# Patient Record
Sex: Female | Born: 1985 | Race: Black or African American | Hispanic: No | Marital: Single
Health system: Southern US, Community
[De-identification: ages and names within clinical notes are randomized; demographics above are authoritative.]

---

## 2017-08-02 ENCOUNTER — Emergency Department (HOSPITAL_COMMUNITY): Payer: PRIVATE HEALTH INSURANCE

## 2017-08-02 ENCOUNTER — Emergency Department (HOSPITAL_COMMUNITY)
Admission: EM | Admit: 2017-08-02 | Discharge: 2017-08-02 | Disposition: A | Discharge status: Home / Self Care | Payer: PRIVATE HEALTH INSURANCE | Attending: Emergency Medicine | Admitting: Emergency Medicine

## 2017-08-02 ENCOUNTER — Other Ambulatory Visit: Payer: Self-pay

## 2017-08-02 ENCOUNTER — Encounter (HOSPITAL_COMMUNITY): Payer: Self-pay

## 2017-08-02 DIAGNOSIS — M25571 Pain in right ankle and joints of right foot: Secondary | ICD-10-CM | POA: Diagnosis not present

## 2017-08-02 DIAGNOSIS — W19XXXA Unspecified fall, initial encounter: Secondary | ICD-10-CM

## 2017-08-02 DIAGNOSIS — M25572 Pain in left ankle and joints of left foot: Secondary | ICD-10-CM | POA: Diagnosis not present

## 2017-08-02 MED ORDER — HYDROCODONE-ACETAMINOPHEN 5-325 MG PO TABS
1.0000 | ORAL_TABLET | Freq: Once | ORAL | Status: AC
  Administered 2017-08-02: 1 via ORAL
  Filled 2017-08-02: qty 1

## 2017-08-02 NOTE — Discharge Instructions (Signed)
X-rays negative.Hairline/stress fractures are often not visualized on x-ray, these are usually treated with rest, ice, elevation, anti-inflammatories and hard soled shoes for protection.  Take 1000 mg of Tylenol +600 mg of ibuprofen every 6-8 hours for pain and swelling.  Where your crutches for the next 3-5 days or until able to ambulate with minimal pain. You should wear postop shoe for at least 7-10 days. Follow up with a primary care doctor in 7-10 days for reevaluation and medical clearance.

## 2017-08-02 NOTE — Progress Notes (Signed)
Orthopedic Tech Progress Note Patient Details:  Carol Copeland [birth year] [id number]  Ortho Devices Type of Ortho Device: Crutches, Postop shoe/boot Ortho Device/Splint Location: RLE Ortho Device/Splint Interventions: Ordered, Application, Adjustment   Post Interventions Patient Tolerated: Well Instructions Provided: Care of device   [name], [name] 08/02/2017, 9:40 PM

## 2017-08-02 NOTE — ED Notes (Signed)
Ortho tech paged  

## 2017-08-02 NOTE — ED Triage Notes (Signed)
Pt presents to the ed for a falling from a standing position this morning, states she tripped and fell down four steps. Pt only complaint is bilateral foot pain. Denies LOC

## 2017-08-02 NOTE — Progress Notes (Signed)
Orthopedic Tech Progress Note Patient Details:  Carol SillShakira Copeland 05-07-1986 [id number]  Ortho Devices Type of Ortho Device: CAM walker Ortho Device/Splint Location: RLE Ortho Device/Splint Interventions: Ordered, Application   Post Interventions Patient Tolerated: Well Instructions Provided: Care of device   Carol MoccasinHughes, Carol Copeland 08/02/2017, 10:44 PM

## 2017-08-02 NOTE — ED Provider Notes (Addendum)
[facility] EMERGENCY DEPARTMENT Provider Note   CSN: [id number] Arrival date & time: 08/02/17  1703     History   Chief Complaint Chief Complaint  Patient presents with  . Fall    HPI Dellie Heinke is a 32 y.o. female here for evaluation of bilateral foot pain onset early this morning, states she was walking through the door and did not realize that there were 4 steps to go down. States she tripped and fell down all 4 steps mostly landing on her heels. Pain is worse to the left lateral aspect of the left ankle and top of her right foot. There is some mild associated swelling. No interventions PTA. Alleviating factors none. Aggravating factors include been weightbearing and palpation.Denies any tingling or numbness distally. There was no head trauma, LOC during the fall. No anticoagulants.  HPI  History reviewed. No pertinent past medical history.  There are no active problems to display for this patient.   History reviewed. No pertinent surgical history.   OB History   None      Home Medications    Prior to Admission medications   Not on File    Family History No family history on file.  Social History Social History   Tobacco Use  . Smoking status: Not on file  Substance Use Topics  . Alcohol use: Not on file  . Drug use: Not on file     Allergies   Penicillins   Review of Systems Review of Systems  Musculoskeletal: Positive for arthralgias, gait problem and joint swelling.  All other systems reviewed and are negative.    Physical Exam Updated Vital Signs BP (!) 142/87 (BP Location: Left Arm)   Pulse 83   Temp 98.9 F (37.2 C) (Oral)   Resp 16   Ht 5\' 6"  (1.676 m)   Wt 131.5 kg (290 lb)   LMP 08/02/2017   SpO2 100%   BMI 46.81 kg/m   Physical Exam  Constitutional: She is oriented to person, place, and time. She appears well-developed and well-nourished.  Non-toxic appearance.  HENT:  Head: Normocephalic.  Right  Ear: External ear normal.  Left Ear: External ear normal.  Nose: Nose normal.  Eyes: Conjunctivae and EOM are normal.  Neck: Full passive range of motion without pain.  Cardiovascular: Normal rate.  2+ DP and PT pulses bilaterally. Toes are warm.  Pulmonary/Chest: Effort normal. No tachypnea. No respiratory distress.  Musculoskeletal: Normal range of motion. She exhibits edema and tenderness.  Left ankle/foot: Mild tenderness to area below the left lateral malleolus. Positive talar tilt. No obvious bony deformity to the left ankle or foot. Achilles is intact. No calcaneal tenderness. Patient able to dorsiflex and plantarflex the ankle with mild discomfort. No obvious laxity to the left ankle. Able to wiggle toes.negative syndesmosis test. Right ankle/foot:mild edema and tenderness along the second, third, fourth, fifth metatarsals. Patient is still able to wiggle her right toes with mild pain. Negative talar tilt. No obvious bony deformity otherwise to the left ankle or foot. Achilles is intact. Pale tenderness. He will to dorsiflex and plantarflex the ankle without discomfort. No obvious laxity to the right ankle. Negative syndesmosis test.  Neurological: She is alert and oriented to person, place, and time.  5/5 strength with ankle flexion/extension, bilaterally. Sensation to light touch grossly intact in bilateral feet.  Skin: Skin is warm and dry. Capillary refill takes less than 2 seconds.  Psychiatric: Her behavior is normal. Thought content normal.  ED Treatments / Results  Labs (all labs ordered are listed, but only abnormal results are displayed) Labs Reviewed - No data to display  EKG None  Radiology Dg Foot Complete Left  Result Date: 08/02/2017 CLINICAL DATA:  Recent fall down stairs, patient reports left and right foot swelling and pain. EXAM: LEFT FOOT - COMPLETE 3+ VIEW COMPARISON:  None. FINDINGS: No fracture.  No bone lesion. The joints are normally spaced and  aligned. There is mild forefoot soft tissue swelling. IMPRESSION: No fracture or dislocation. Electronically Signed   By: Amie Portlandavid  Ormond M.D.   On: 08/02/2017 20:15   Dg Foot Complete Right  Result Date: 08/02/2017 CLINICAL DATA:  Recent fall down stairs, patient reports left and right foot swelling and pain. EXAM: RIGHT FOOT COMPLETE - 3+ VIEW COMPARISON:  None. FINDINGS: There is deformity of the distal second and third metatarsals without a discrete fracture line or disruption of the trabecula. This may be the result of old trauma. No convincing acute fracture. The joints are normally spaced and aligned. There is mild forefoot soft tissue swelling. IMPRESSION: No convincing acute fracture or dislocation. Electronically Signed   By: Amie Portlandavid  Ormond M.D.   On: 08/02/2017 20:13    Procedures Procedures (including critical care time)  Medications Ordered in ED Medications  HYDROcodone-acetaminophen (NORCO/VICODIN) 5-325 MG per tablet 1 tablet (1 tablet Oral Given 08/02/17 1911)     Initial Impression / Assessment and Plan / ED Course  I have reviewed the triage vital signs and the nursing notes.  Pertinent labs & imaging results that were available during my care of the patient were reviewed by me and considered in my medical decision making (see chart for details).    X-rays as above without fracture, dislocation. There is a questionable old bony injury to the distal aspect of the second and third metatarsals without convincing acute fracture, patient does have edema and tenderness focally here. She remembers remote injury to this area There may be a small stress/hairline fracture to this area that is not presenting itself on the x-ray. Will treat with cam walker on right foot, crutches so she is able to ambulate comfortably and stay off the left foot as well. She stood up in ED and had brief episode of light-headedness that resolved when she sat down. VS WNL at this time. She attempted walking in  ED and able to without dizziness. Rice protocol.  Discussed return precautions. Patient verbalized understanding and agreeable.  Final Clinical Impressions(s) / ED Diagnoses   Final diagnoses:  Fall, initial encounter  Acute bilateral ankle pain    ED Discharge Orders    None       Jerrell MylarGibbons, Ravindra Baranek J, PA-C 08/02/17 2249    [name], [name], MD 08/14/17 1529

## 2017-08-02 NOTE — ED Provider Notes (Signed)
Patient placed in Quick Look pathway, seen and evaluated   Chief Complaint: bilateral foot pain  HPI:   Carol Copeland is a 32 y.o. female who presents to the ED with bilateral foot pain after falling down steps at a hotel early this morning. Patient reports that she tripped causing the fall. Patient denies any injuries other than her feet. She took ibuprofen earlier today without relief.   ROS: M/S: bilateral foot pain  Physical Exam:   Gen: No distress  Neuro: Awake and Alert  Skin: Warm and dry  M/S: swelling to the dorsum of both feet, tender with range of motion and palpation. Pedal pulses present.     Focused Exam:    Initiation of care has begun. The patient has been counseled on the process, plan, and necessity for staying for the completion/evaluation, and the remainder of the medical screening examination    [name], [name], NP 08/02/17 1911    [name], [name], MD 08/14/17 [phone]

## 2019-01-30 IMAGING — CR DG FOOT COMPLETE 3+V*L*
3 series · 3 of 3 positions shown · non-contrast
Comparison: None.

CLINICAL DATA: Recent fall down stairs, patient reports left and
right foot swelling and pain.

EXAM:
LEFT FOOT - COMPLETE 3+ VIEW

[foot ap]
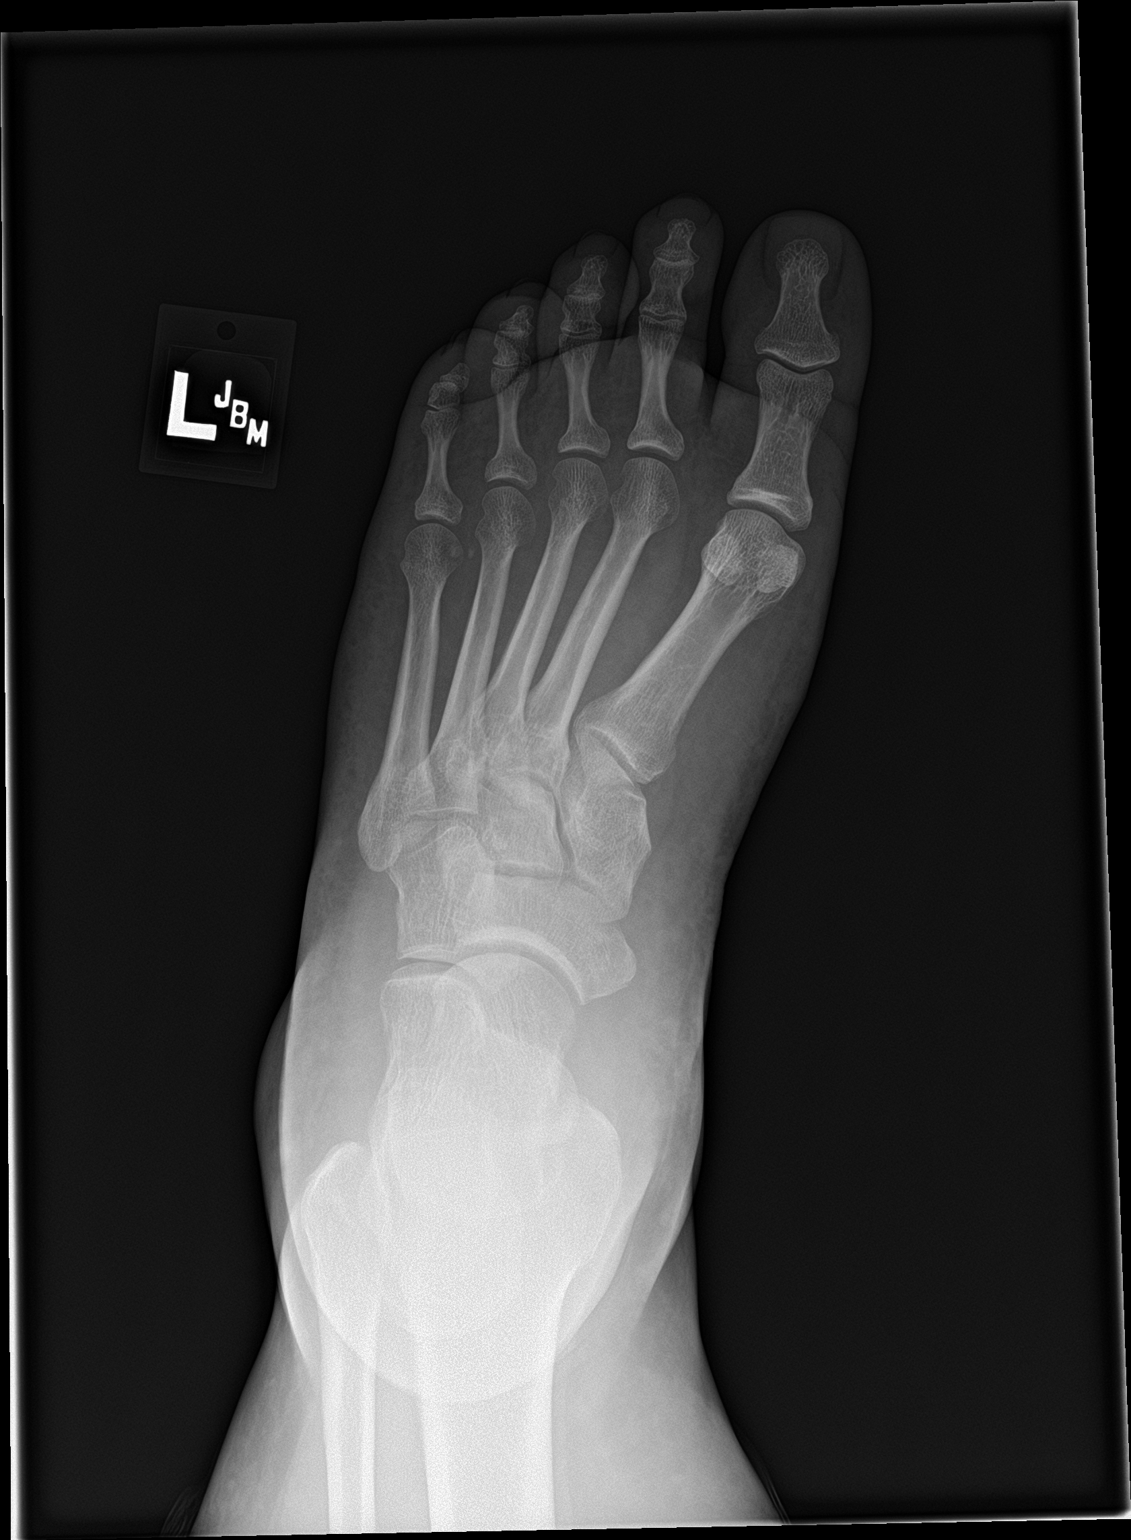

[foot obl]
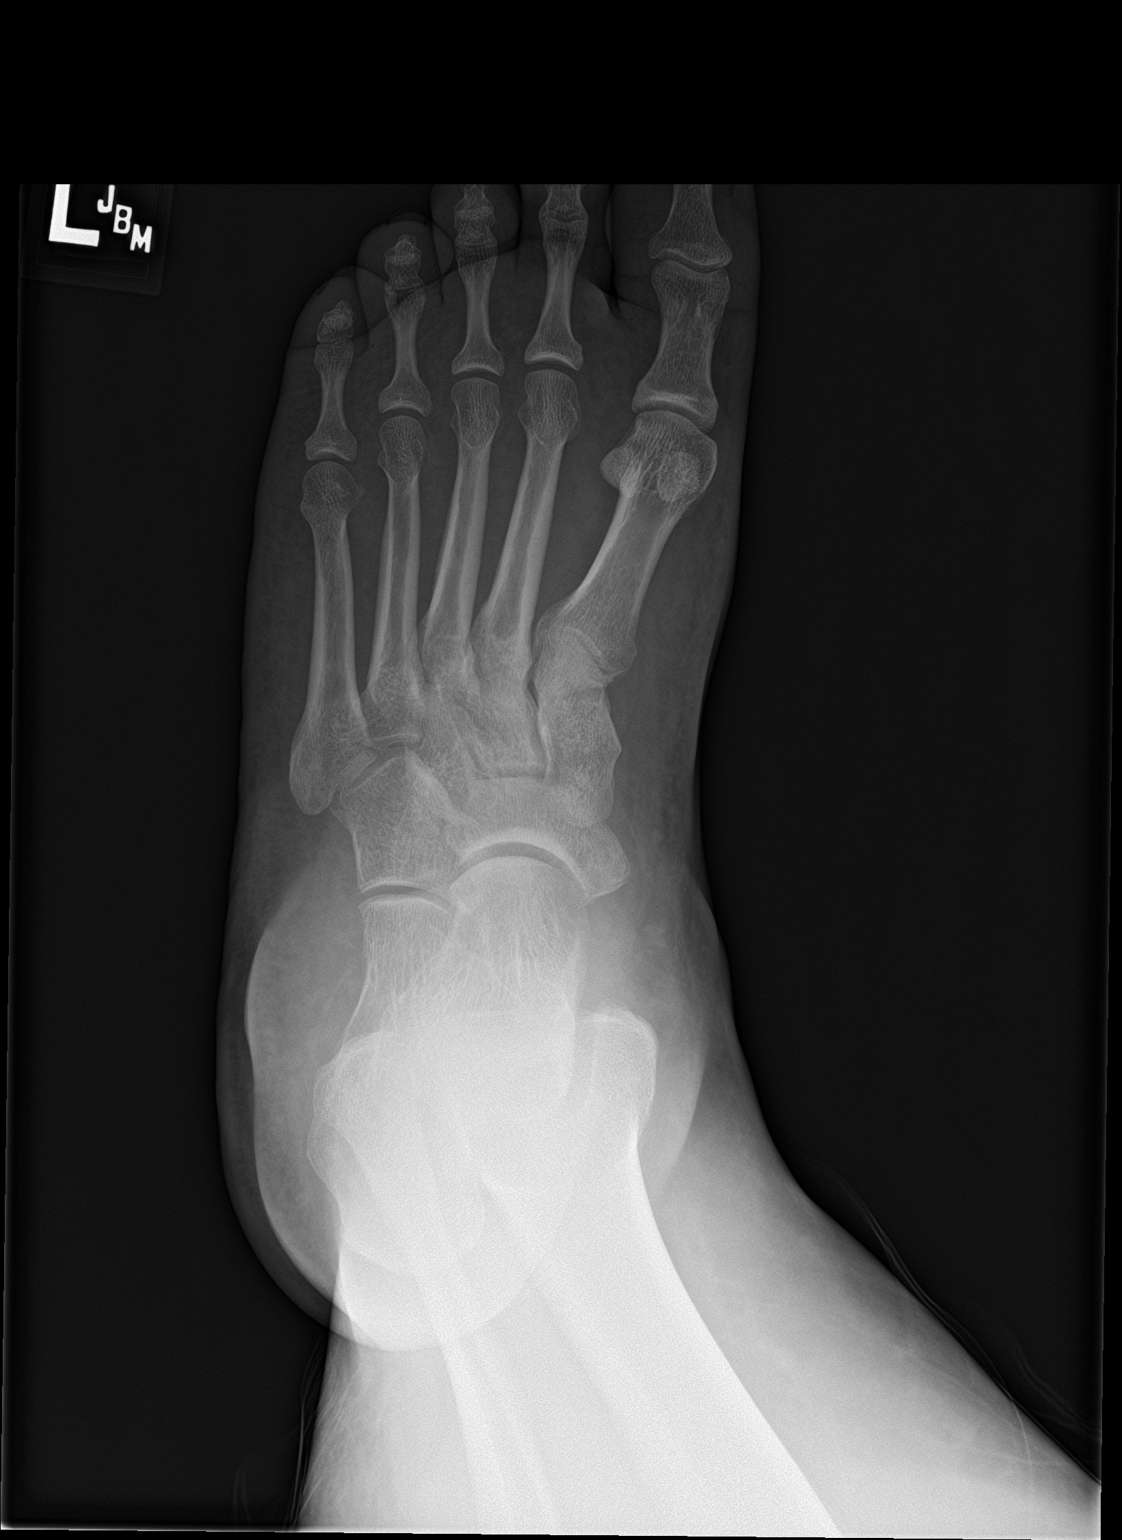

[foot lat]
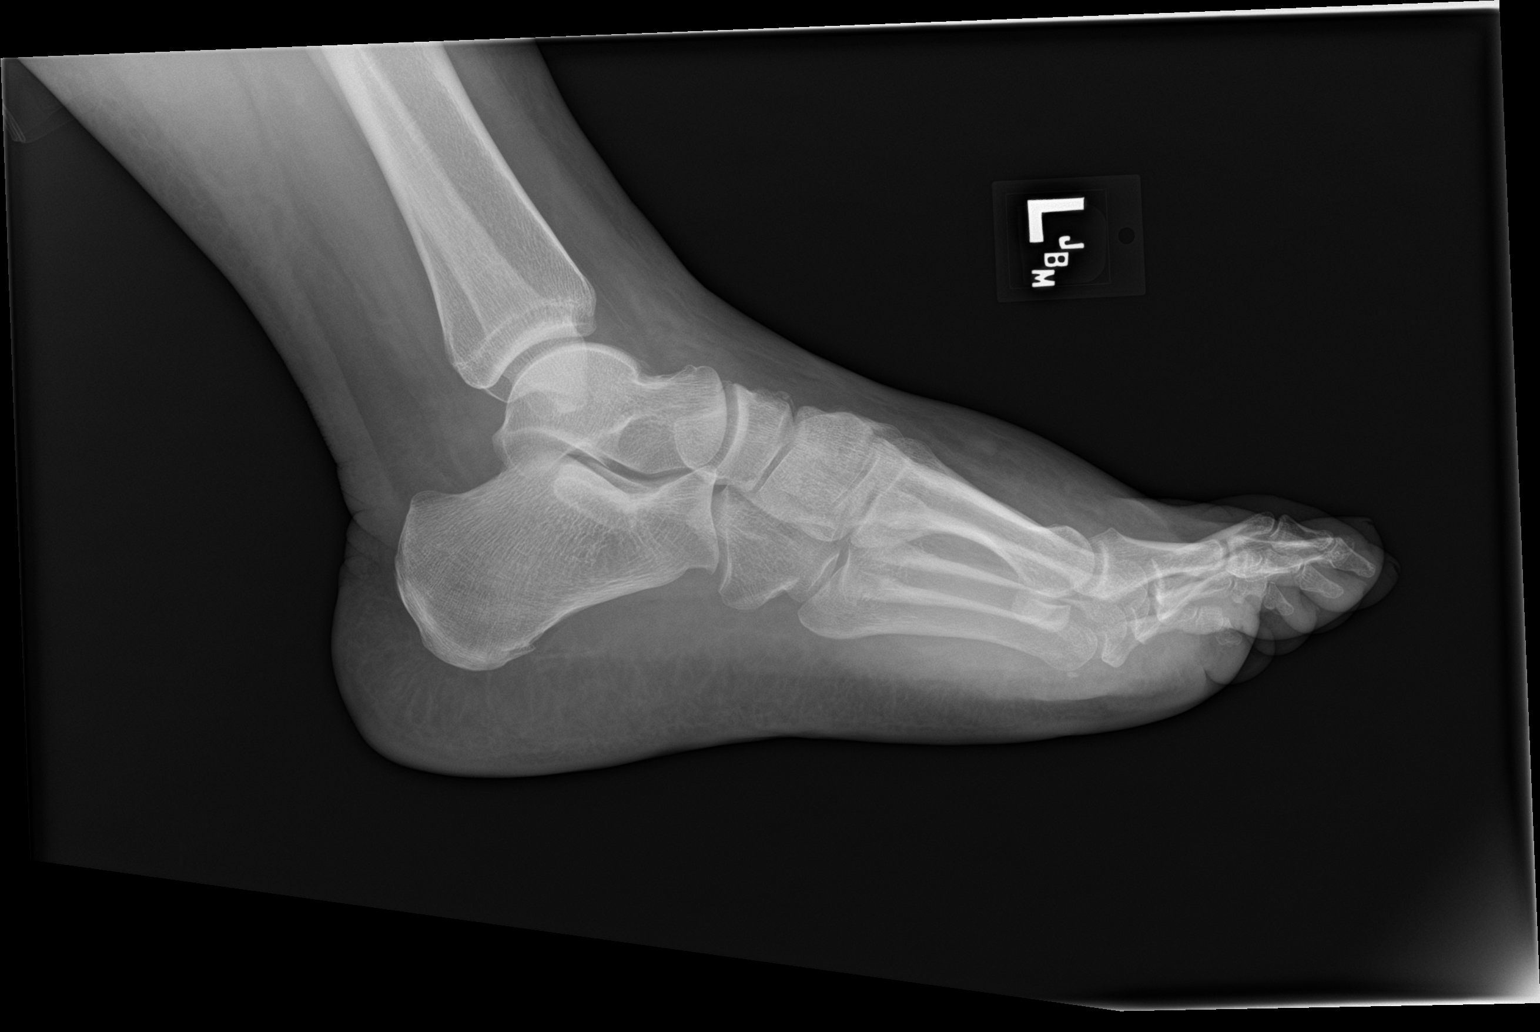

[3 of 3 positions shown; findings below may reference images not displayed]

FINDINGS: No fracture.  No bone lesion.

The joints are normally spaced and aligned.

There is mild forefoot soft tissue swelling.
IMPRESSION: No fracture or dislocation.
